# Patient Record
Sex: Male | Born: 1985 | Race: White | Hispanic: No | Marital: Married | Smoking: Never smoker
Health system: Southern US, Community
[De-identification: ages and names within clinical notes are randomized; demographics above are authoritative.]

## PROBLEM LIST (undated history)

## (undated) DIAGNOSIS — J45909 Unspecified asthma, uncomplicated: Secondary | ICD-10-CM

## (undated) HISTORY — DX: Unspecified asthma, uncomplicated: J45.909

---
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
## 2008-12-22 ENCOUNTER — Emergency Department (HOSPITAL_COMMUNITY): Admission: EM | Admit: 2008-12-22 | Discharge: 2008-12-22 | Payer: Self-pay | Admitting: Emergency Medicine

## 2014-09-17 ENCOUNTER — Ambulatory Visit: Payer: Self-pay | Admitting: Physical Therapy

## 2016-06-23 DIAGNOSIS — Z23 Encounter for immunization: Secondary | ICD-10-CM | POA: Diagnosis not present

## 2017-05-19 ENCOUNTER — Other Ambulatory Visit: Payer: Self-pay | Admitting: Family Medicine

## 2017-05-19 ENCOUNTER — Ambulatory Visit
Admission: RE | Admit: 2017-05-19 | Discharge: 2017-05-19 | Disposition: A | Discharge status: Home / Self Care | Payer: BLUE CROSS/BLUE SHIELD | Source: Ambulatory Visit | Attending: Family Medicine | Admitting: Family Medicine

## 2017-05-19 DIAGNOSIS — T1490XA Injury, unspecified, initial encounter: Secondary | ICD-10-CM

## 2017-05-19 DIAGNOSIS — S6992XA Unspecified injury of left wrist, hand and finger(s), initial encounter: Secondary | ICD-10-CM | POA: Diagnosis not present

## 2017-05-19 DIAGNOSIS — Z23 Encounter for immunization: Secondary | ICD-10-CM | POA: Diagnosis not present

## 2017-05-19 DIAGNOSIS — M25532 Pain in left wrist: Secondary | ICD-10-CM

## 2017-07-21 DIAGNOSIS — M25532 Pain in left wrist: Secondary | ICD-10-CM | POA: Diagnosis not present

## 2017-09-13 DIAGNOSIS — H3554 Dystrophies primarily involving the retinal pigment epithelium: Secondary | ICD-10-CM | POA: Diagnosis not present

## 2017-10-11 DIAGNOSIS — H3554 Dystrophies primarily involving the retinal pigment epithelium: Secondary | ICD-10-CM | POA: Diagnosis not present

## 2018-01-25 IMAGING — CR DG WRIST COMPLETE 3+V*L*
4 series · 4 of 4 positions shown · non-contrast
Comparison: None.

CLINICAL DATA: Pain following fall

EXAM:
LEFT WRIST - COMPLETE 3+ VIEW

[x wrist pa left]
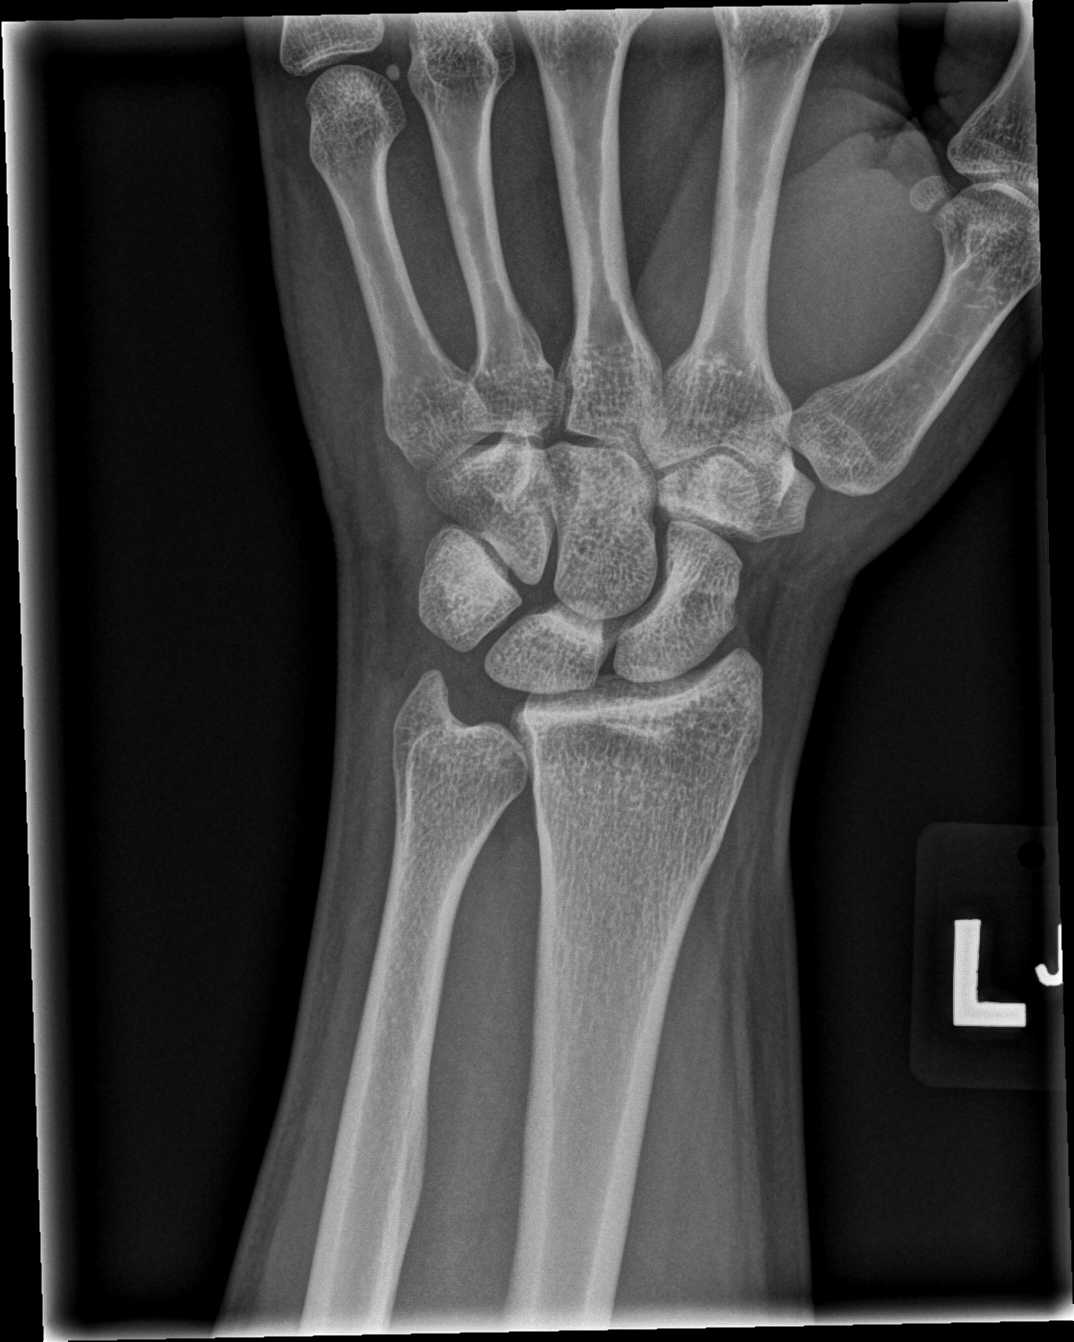

[x wrist navicular view left]
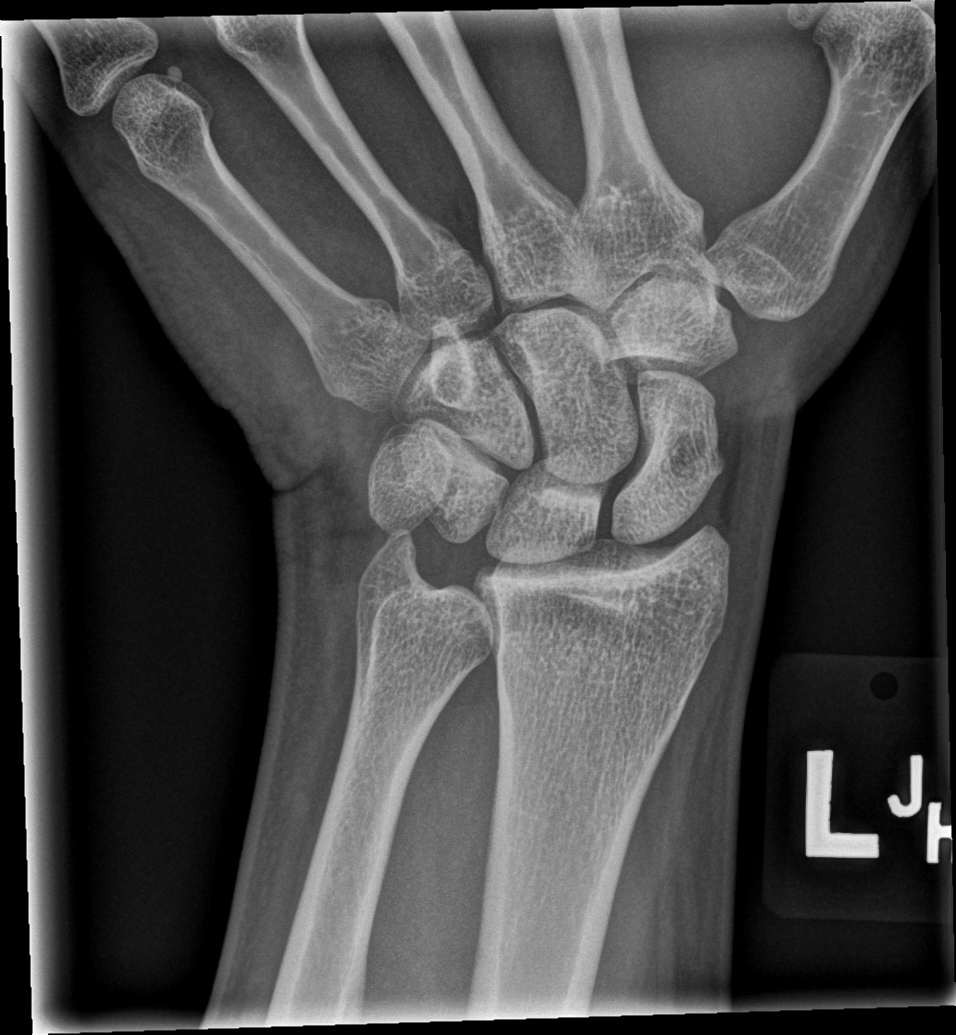

[x wrist obl left]
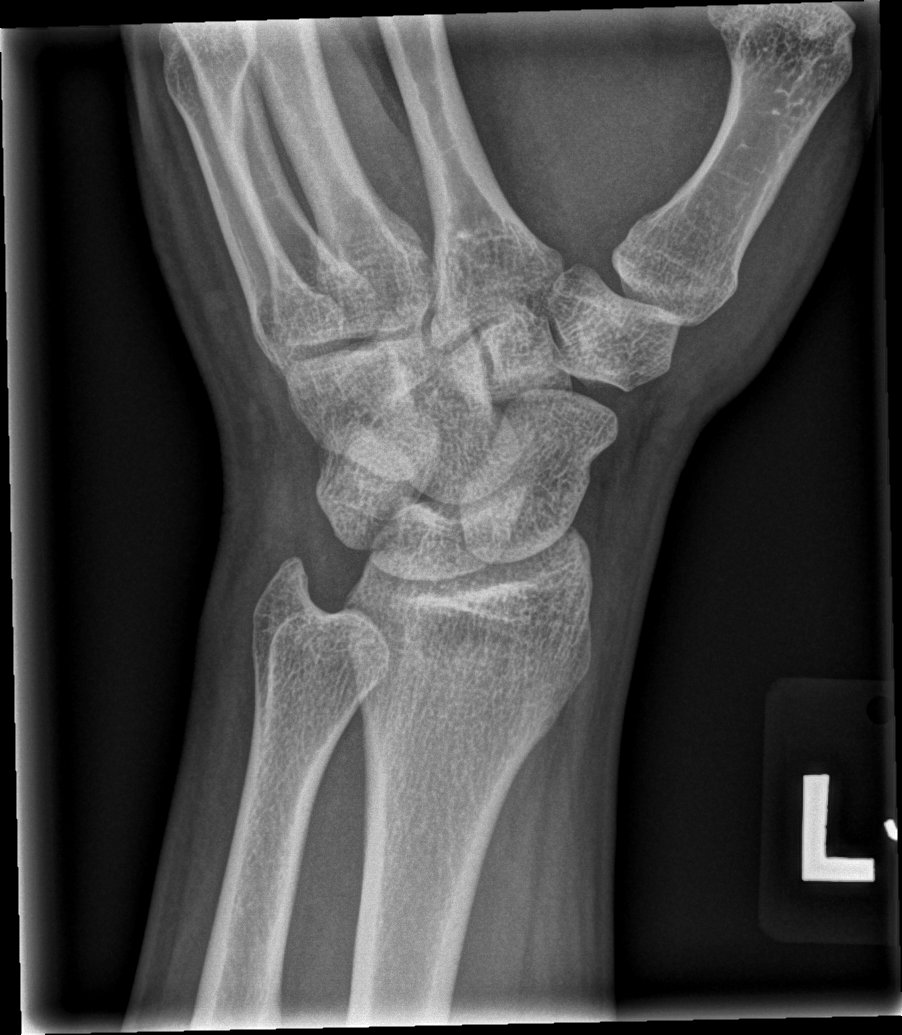

[x wrist lat left]
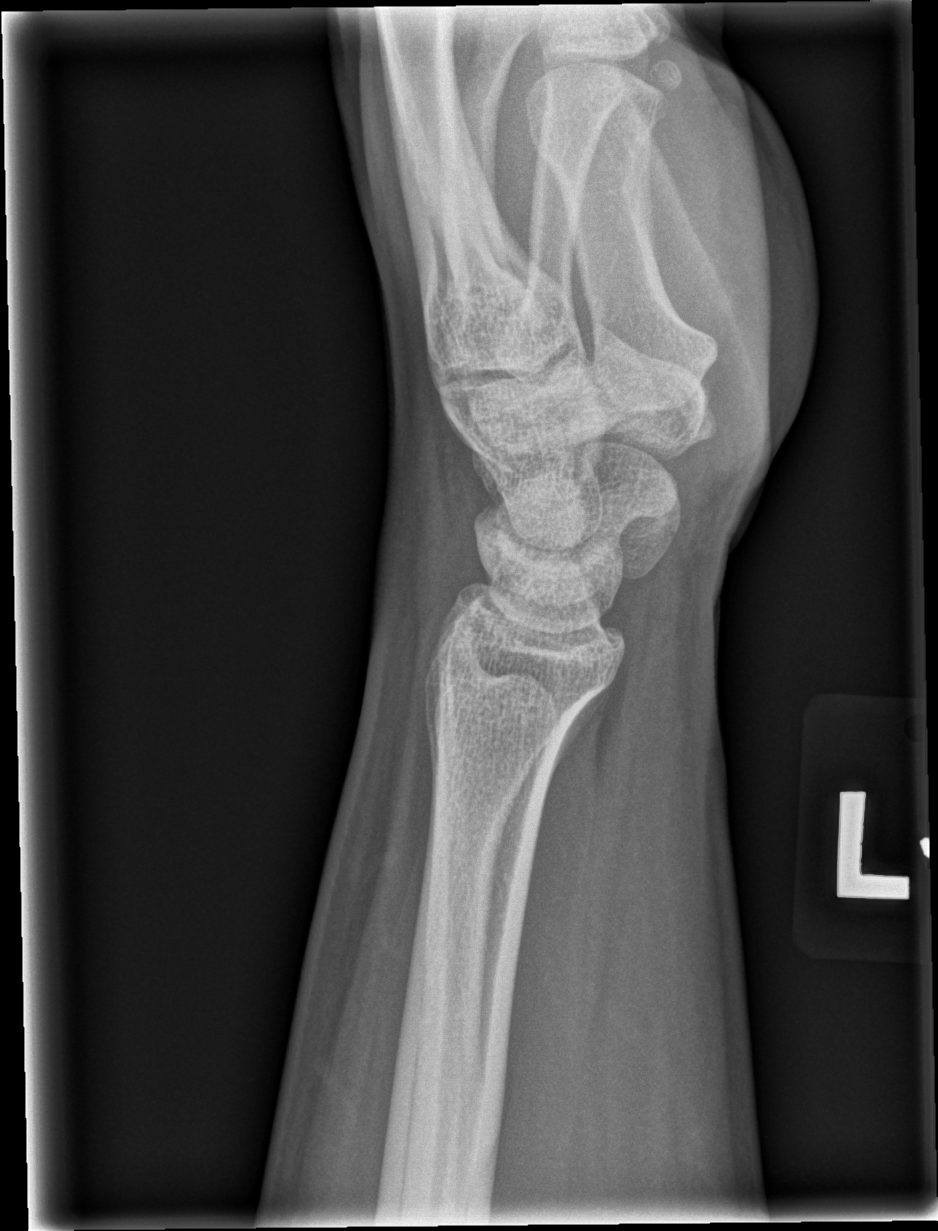

[4 of 4 positions shown; findings below may reference images not displayed]

FINDINGS: Frontal, oblique, lateral, and ulnar deviation scaphoid images were
obtained. No fracture or dislocation. There is a benign-appearing
cyst in the mid scaphoid bone. No appreciable joint space narrowing
or erosion. No abnormal calcification.
IMPRESSION: No fracture or dislocation. No evident arthropathy. Benign appearing
cyst in mid scaphoid bone.

## 2018-04-13 DIAGNOSIS — H3554 Dystrophies primarily involving the retinal pigment epithelium: Secondary | ICD-10-CM | POA: Diagnosis not present

## 2018-08-24 DIAGNOSIS — S0191XA Laceration without foreign body of unspecified part of head, initial encounter: Secondary | ICD-10-CM | POA: Diagnosis not present

## 2018-10-10 DIAGNOSIS — M278 Other specified diseases of jaws: Secondary | ICD-10-CM | POA: Diagnosis not present

## 2018-11-21 DIAGNOSIS — R109 Unspecified abdominal pain: Secondary | ICD-10-CM | POA: Diagnosis not present

## 2018-11-22 ENCOUNTER — Other Ambulatory Visit: Payer: Self-pay | Admitting: Family Medicine

## 2018-11-22 ENCOUNTER — Ambulatory Visit
Admission: RE | Admit: 2018-11-22 | Discharge: 2018-11-22 | Disposition: A | Discharge status: Home / Self Care | Payer: BLUE CROSS/BLUE SHIELD | Source: Ambulatory Visit | Attending: Family Medicine | Admitting: Family Medicine

## 2018-11-22 ENCOUNTER — Other Ambulatory Visit: Payer: Self-pay

## 2018-11-22 DIAGNOSIS — N200 Calculus of kidney: Secondary | ICD-10-CM | POA: Diagnosis not present

## 2018-11-22 DIAGNOSIS — R109 Unspecified abdominal pain: Secondary | ICD-10-CM

## 2019-05-11 DIAGNOSIS — Z23 Encounter for immunization: Secondary | ICD-10-CM | POA: Diagnosis not present

## 2019-07-31 IMAGING — CT CT ABDOMEN AND PELVIS WITHOUT CONTRAST
1 of 2 series · 13 of 32 positions shown, 18 images · non-contrast
Comparison: None.

CLINICAL DATA: Right flank pain

EXAM:
CT ABDOMEN AND PELVIS WITHOUT CONTRAST
TECHNIQUE: Multidetector CT imaging of the abdomen and pelvis was performed
following the standard protocol without oral or IV contrast.

[Series 2: abd/pelvis w/(date) · axial · 0.83mm/px · z∈[-516,-91]mm · 13 of 97 slices shown, 18 images]
[im 6/97  soft-tissue]
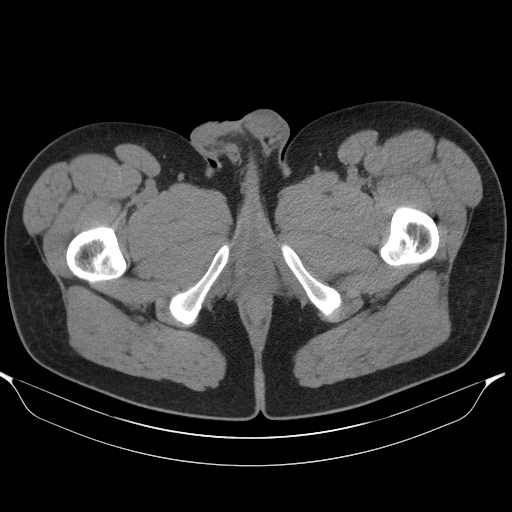
[im 6/97  bone]
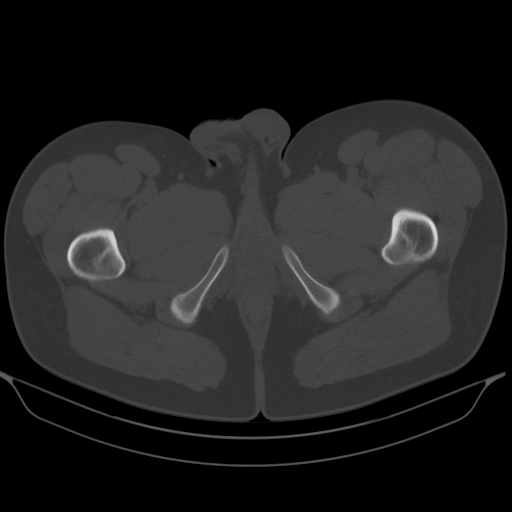
[im 17/97  soft-tissue]
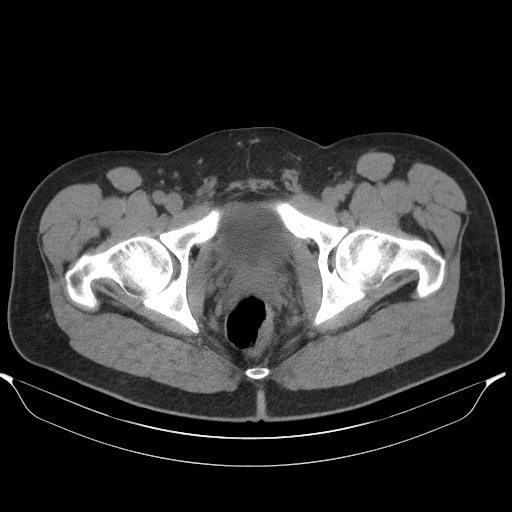
[im 22/97  soft-tissue]
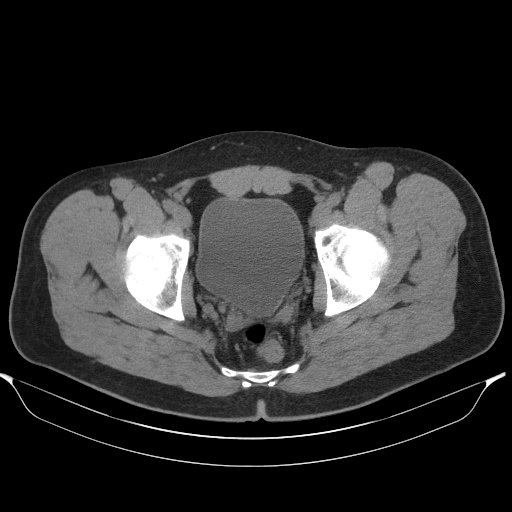
[im 27/97  soft-tissue]
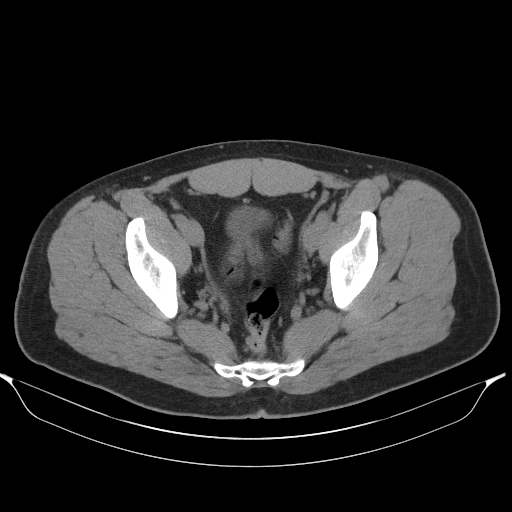
[im 38/97  soft-tissue]
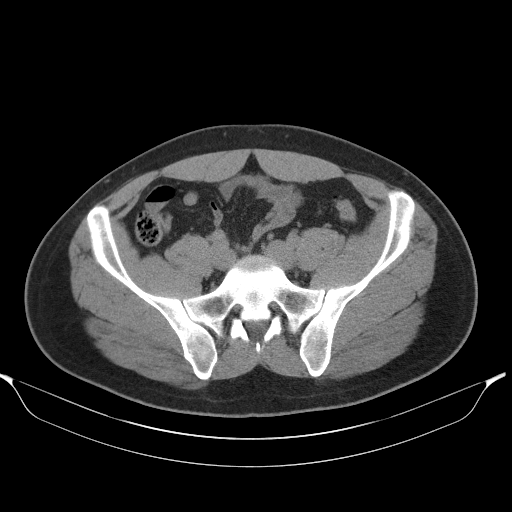
[im 43/97  soft-tissue]
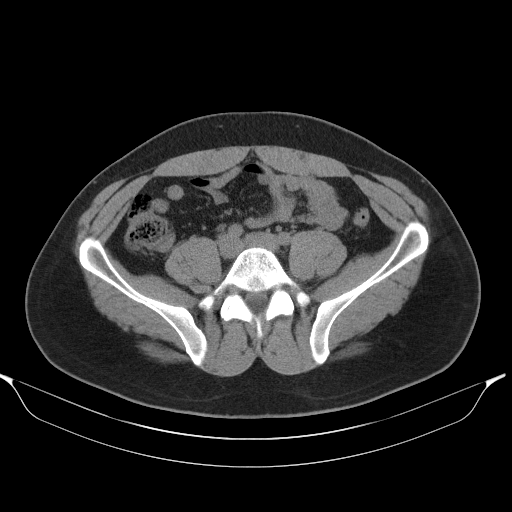
[im 54/97  soft-tissue]
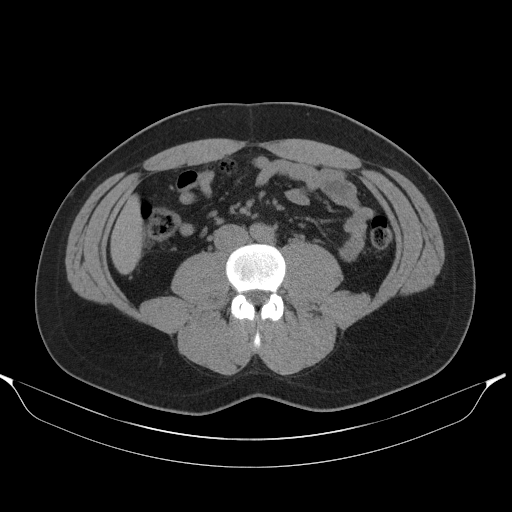
[im 59/97  soft-tissue]
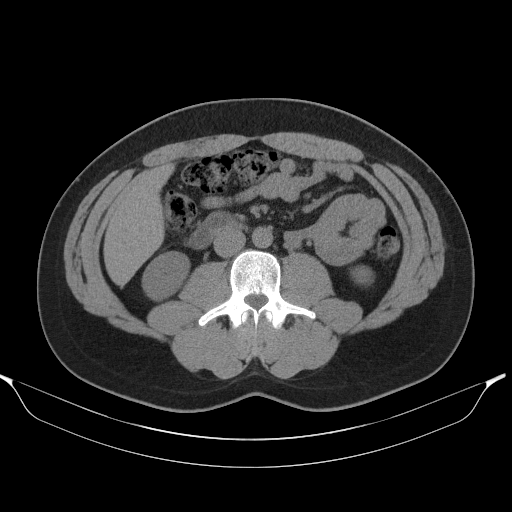
[im 70/97  soft-tissue]
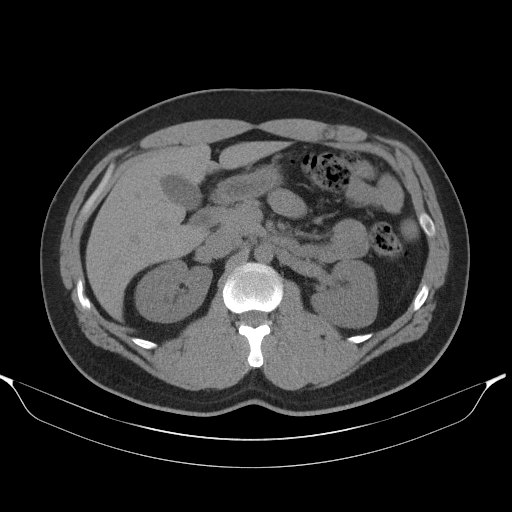
[im 70/97  bone]
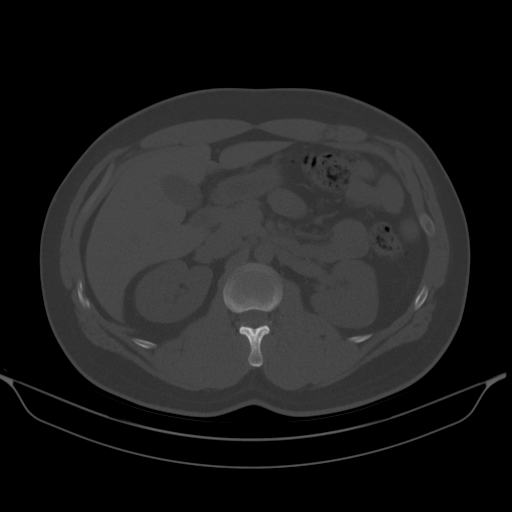
[im 75/97  soft-tissue]
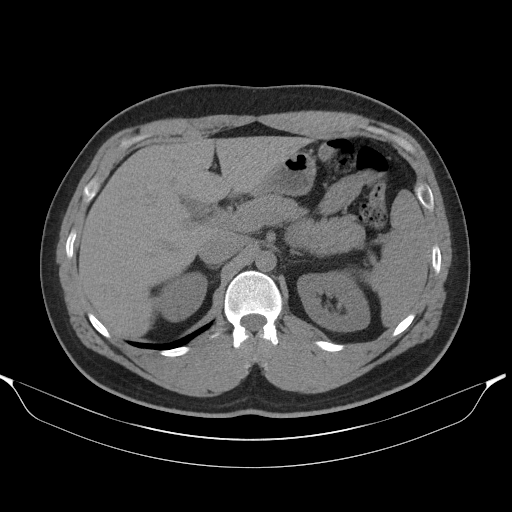
[im 75/97  lung]
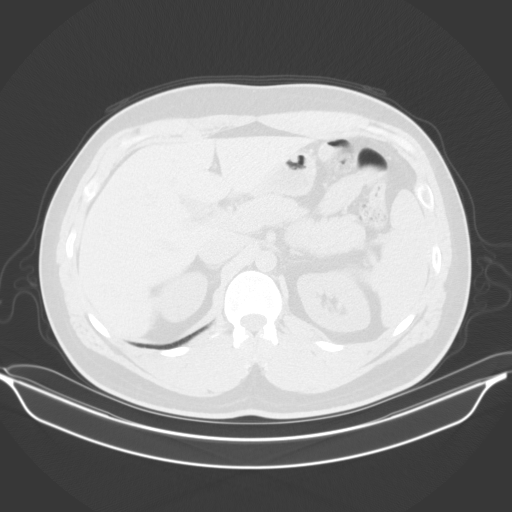
[im 81/97  soft-tissue]
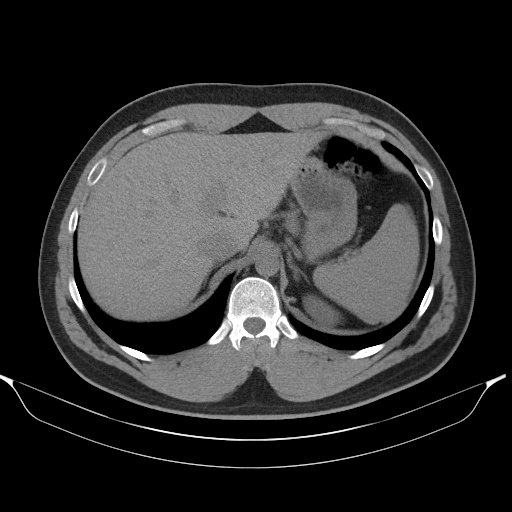
[im 81/97  lung]
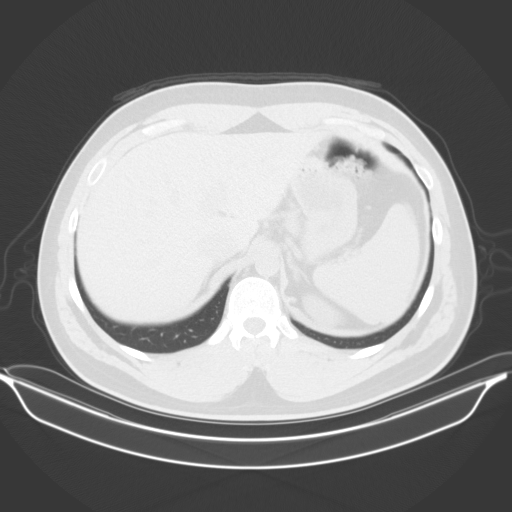
[im 86/97  lung]
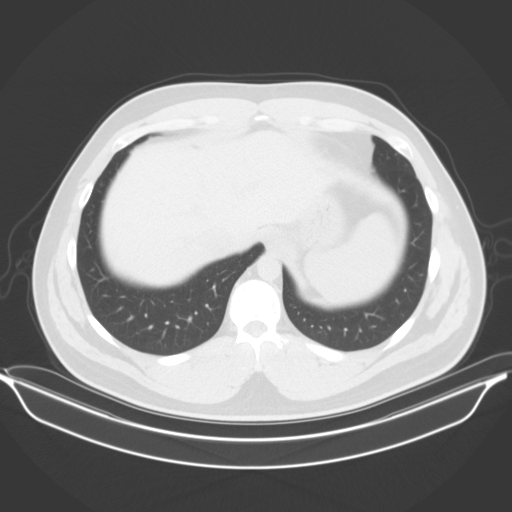
[im 91/97  soft-tissue]
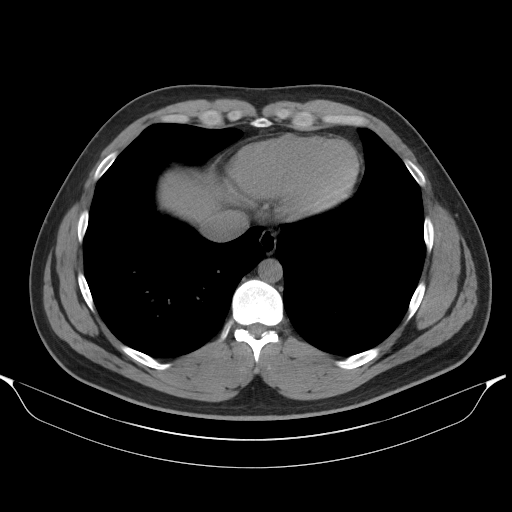
[im 91/97  lung]
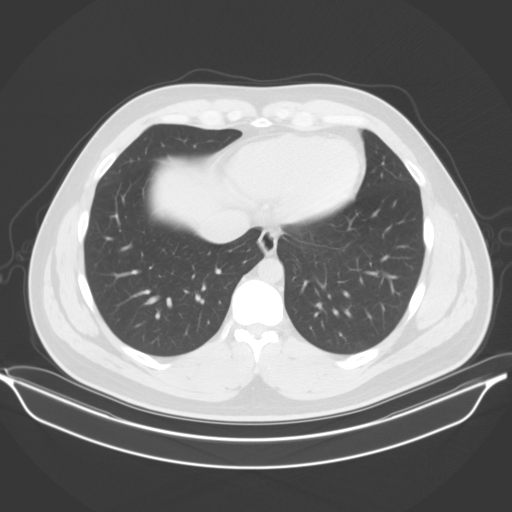

[13 of 32 positions shown; findings below may reference images not displayed]

FINDINGS: Lower chest: Lung bases are clear.

Hepatobiliary: No focal liver lesions are appreciable on this
noncontrast enhanced study. Gallbladder wall is not appreciably
thickened. There is no biliary duct dilatation.

Pancreas: There is no appreciable pancreatic mass or inflammatory
focus.

Spleen: No splenic lesions are evident.

Adrenals/Urinary Tract: Adrenals bilaterally appear normal. Kidneys
bilaterally show no evident mass or hydronephrosis on either side.
There is no appreciable renal or ureteral calculus on either side.
Urinary bladder is midline wall thickness within normal limits.

Stomach/Bowel: There is no appreciable bowel wall or mesenteric
thickening. There is no evident bowel obstruction. There is no free
air or portal venous air.

Vascular/Lymphatic: There is no abdominal aortic aneurysm. No
vascular lesions are evident on this noncontrast enhanced study.
There is no adenopathy in the abdomen or pelvis.

Reproductive: Prostate and seminal vesicles are normal in size and
contour. No evident pelvic mass.

Other: Appendix appears normal. There is no appreciable abscess or
ascites in the abdomen or pelvis.

Musculoskeletal: There is a benign-appearing lesion in the right
intertrochanteric region with a lucent central region and sclerotic
peripheral region measuring 1.8 x 1.8 cm. No destructive or purely
sclerotic type lesions are evident. No intramuscular or abdominal
wall lesion evident.
IMPRESSION: 1. A cause for patient's symptoms has not been established with this
study.

2. No hydronephrosis. No renal or ureteral calculus on either side.

3. No evident bowel wall thickening or bowel obstruction. No abscess
in the abdomen or pelvis. Appendix appears normal.

4. Benign-appearing lesion in the intertrochanteric region of the
proximal right femur with lucent central and sclerotic peripheral
regions. This lesion measures 1.8 x 1.8 cm.

## 2019-11-03 ENCOUNTER — Ambulatory Visit: Payer: 59 | Attending: Internal Medicine

## 2019-11-03 DIAGNOSIS — Z23 Encounter for immunization: Secondary | ICD-10-CM

## 2019-11-03 NOTE — Progress Notes (Signed)
   Covid-19 Vaccination Clinic  Name:  Colin Meadows    MRN: [id number] DOB: [birth year]  11/03/2019  [name] was observed post Covid-19 immunization for 15 minutes without incident. He was provided with Vaccine Information Sheet and instruction to access the V-Safe system.   [name] was instructed to call 911 with any severe reactions post vaccine: Marland Kitchen Difficulty breathing  . Swelling of face and throat  . A fast heartbeat  . A bad rash all over body  . Dizziness and weakness   Immunizations Administered    Name Date Dose VIS Date Route   Pfizer COVID-19 Vaccine 11/03/2019  8:52 AM 0.3 mL 07/14/2019 Intramuscular   Manufacturer: [employer], [employer]   Lot: [id number]   NDC: [address]-0

## 2019-11-29 ENCOUNTER — Ambulatory Visit: Payer: 59 | Attending: Internal Medicine

## 2019-11-29 DIAGNOSIS — Z23 Encounter for immunization: Secondary | ICD-10-CM

## 2019-11-29 NOTE — Progress Notes (Signed)
   Covid-19 Vaccination Clinic  Name:  Colin Meadows    MRN: [id number] DOB: 1985/08/21  11/29/2019  [name] was observed post Covid-19 immunization for 15 minutes without incident. He was provided with Vaccine Information Sheet and instruction to access the V-Safe system.   [name] was instructed to call 911 with any severe reactions post vaccine: Marland Kitchen Difficulty breathing  . Swelling of face and throat  . A fast heartbeat  . A bad rash all over body  . Dizziness and weakness   Immunizations Administered    Name Date Dose VIS Date Route   Pfizer COVID-19 Vaccine 11/29/2019  8:41 AM 0.3 mL 09/27/2018 Intramuscular   Manufacturer: [employer], [employer]   Lot: [id number]   NDC: [address]-2

## 2020-05-13 DIAGNOSIS — H3552 Pigmentary retinal dystrophy: Secondary | ICD-10-CM | POA: Diagnosis not present

## 2020-06-04 DIAGNOSIS — H35363 Drusen (degenerative) of macula, bilateral: Secondary | ICD-10-CM | POA: Diagnosis not present

## 2020-07-30 DIAGNOSIS — U071 COVID-19: Secondary | ICD-10-CM | POA: Diagnosis not present

## 2020-11-21 DIAGNOSIS — H3552 Pigmentary retinal dystrophy: Secondary | ICD-10-CM | POA: Diagnosis not present

## 2021-01-07 DIAGNOSIS — Z3009 Encounter for other general counseling and advice on contraception: Secondary | ICD-10-CM | POA: Diagnosis not present

## 2021-01-31 DIAGNOSIS — Z302 Encounter for sterilization: Secondary | ICD-10-CM | POA: Diagnosis not present

## 2021-09-15 ENCOUNTER — Ambulatory Visit (HOSPITAL_BASED_OUTPATIENT_CLINIC_OR_DEPARTMENT_OTHER): Payer: BC Managed Care – PPO | Admitting: Family Medicine

## 2021-09-15 ENCOUNTER — Encounter (HOSPITAL_BASED_OUTPATIENT_CLINIC_OR_DEPARTMENT_OTHER): Payer: Self-pay | Admitting: Family Medicine

## 2021-09-15 ENCOUNTER — Other Ambulatory Visit: Payer: Self-pay

## 2021-09-15 VITALS — BP 120/64 | HR 76 | Ht 74.0 in | Wt 238.0 lb

## 2021-09-15 DIAGNOSIS — J4520 Mild intermittent asthma, uncomplicated: Secondary | ICD-10-CM | POA: Diagnosis not present

## 2021-09-15 DIAGNOSIS — J45909 Unspecified asthma, uncomplicated: Secondary | ICD-10-CM | POA: Insufficient documentation

## 2021-09-15 DIAGNOSIS — Z0000 Encounter for general adult medical examination without abnormal findings: Secondary | ICD-10-CM

## 2021-09-15 MED ORDER — ALBUTEROL SULFATE HFA 108 (90 BASE) MCG/ACT IN AERS: 2.0000 | INHALATION_SPRAY | Freq: Four times a day (QID) | RESPIRATORY_TRACT | 2 refills | Status: AC | PRN

## 2021-09-15 NOTE — Progress Notes (Signed)
New Patient Office Visit  Subjective:  Patient ID: Colin Meadows, male    DOB: 03/26/1986  Age: 36 y.o. MRN: [id number]  CC:  Chief Complaint  Patient presents with   Establish Care    No prior PCP - No acute concerns or complaints today.    HPI Colin Meadows is a 36 year old male presenting to establish in clinic.  He has current concerns as outlined above.  Reports past medical history of asthma.  Asthma: Reports that he uses albuterol inhaler as needed.  Generally has very infrequent use of albuterol inhaler.  Did use it after coronavirus infection.  Has been using his wife's inhaler as he has not had his own recently.  Patient is originally from [place].  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  Outside of work he enjoys mountain biking, working out, has 3 young kids so spends lots of family time.  Past Medical History:  Diagnosis Date   Asthma 06[birth year]14[birth year]86   Diagnosed as a child.    History reviewed. No pertinent surgical history.  Family History  Problem Relation Age of Onset   Osteoporosis Mother    Heart disease Father    Vision loss Father    Macular degeneration Father    Alzheimer's disease Maternal Grandmother    Cancer Maternal Grandfather        stomach   Cancer Paternal Grandfather        stomach    Social History   Socioeconomic History   Marital status: Married    Spouse name: Not on file   Number of children: Not on file   Years of education: Not on file   Highest education level: Not on file  Occupational History   Not on file  Tobacco Use   Smoking status: Never   Smokeless tobacco: Never  Vaping Use   Vaping Use: Never used  Substance and Sexual Activity   Alcohol use: Yes    Alcohol/week: 2.0 standard drinks    Types: 2 Cans of beer per week   Drug use: Never   Sexual activity: Yes    Birth control/protection: Surgical    Comment: Vasectomy  Other Topics Concern   Not on file  Social History Narrative    Not on file   Social Determinants of Health   Financial Resource Strain: Not on file  Food Insecurity: Not on file  Transportation Needs: Not on file  Physical Activity: Not on file  Stress: Not on file  Social Connections: Not on file  Intimate Partner Violence: Not on file    Objective:   Today's Vitals: BP 120/64    Pulse 76    Ht 6\' 2"  (1.88 m)    Wt 238 lb (108 kg)    SpO2 98%    BMI 30.56 kg/m   Physical Exam  36 year old male in no acute distress Cardiovascular exam with regular rate and rhythm, no murmur appreciated Lungs clear to auscultation bilaterally  Assessment & Plan:   Problem List Items Addressed This Visit       Respiratory   Asthma - Primary    By history appears to be mild intermittent Sent in prescription for albuterol inhaler to be used as needed      Relevant Medications   albuterol (VENTOLIN HFA) 108 (90 Base) MCG/ACT inhaler   Other Visit Diagnoses     Wellness examination       Relevant Orders   CBC with Differential/Platelet   Comprehensive  metabolic panel   Lipid panel   TSH Rfx on Abnormal to Free T4       Outpatient Encounter Medications as of 09/15/2021  Medication Sig   albuterol (VENTOLIN HFA) 108 (90 Base) MCG/ACT inhaler Inhale 2 puffs into the lungs every 6 (six) hours as needed for wheezing.   No facility-administered encounter medications on file as of 09/15/2021.    Follow-up: Return in about 2 months (around 11/13/2021) for Follow Up.  Plan for follow-up in about 1 to 2 months for CPE with labs to be completed 1 week prior  Daisey Caloca J [place], MD

## 2021-09-15 NOTE — Patient Instructions (Signed)
°  Medication Instructions:  °Your physician recommends that you continue on your current medications as directed. Please refer to the Current Medication list given to you today. °--If you need a refill on any your medications before your next appointment, please call your pharmacy first. If no refills are authorized on file call the office.-- °Follow-Up: °Your next appointment:   °Your physician recommends that you schedule a follow-up appointment in: 1-2 MONTHS for CPE with Dr. de [place] ° °You will receive a text message or e-mail with a link to a survey about your care and experience with us today! We would greatly appreciate your feedback!  ° °Thanks for letting us be apart of your health journey!!  °Primary Care and Sports Medicine  ° °Dr. Raymond de Cuba  ° °We encourage you to activate your patient portal called "MyChart".  Sign up information is provided on this After Visit Summary.  MyChart is used to connect with patients for Virtual Visits (Telemedicine).  Patients are able to view lab/test results, encounter notes, upcoming appointments, etc.  Non-urgent messages can be sent to your provider as well. To learn more about what you can do with MyChart, please visit --  [email or web address]   ° °

## 2021-09-15 NOTE — Assessment & Plan Note (Signed)
By history appears to be mild intermittent Sent in prescription for albuterol inhaler to be used as needed

## 2021-09-26 ENCOUNTER — Ambulatory Visit (HOSPITAL_BASED_OUTPATIENT_CLINIC_OR_DEPARTMENT_OTHER): Payer: 59 | Admitting: Family Medicine

## 2021-10-06 ENCOUNTER — Other Ambulatory Visit: Payer: Self-pay

## 2021-10-06 ENCOUNTER — Ambulatory Visit (HOSPITAL_BASED_OUTPATIENT_CLINIC_OR_DEPARTMENT_OTHER): Payer: BC Managed Care – PPO

## 2021-10-06 DIAGNOSIS — Z0000 Encounter for general adult medical examination without abnormal findings: Secondary | ICD-10-CM

## 2021-10-07 LAB — COMPREHENSIVE METABOLIC PANEL
ALT: 38 IU/L (ref 0–44)
AST: 40 IU/L (ref 0–40)
Albumin/Globulin Ratio: 1.9 (ref 1.2–2.2)
Albumin: 4.5 g/dL (ref 4.0–5.0)
Alkaline Phosphatase: 56 IU/L (ref 44–121)
BUN/Creatinine Ratio: 16 (ref 9–20)
BUN: 22 mg/dL — ABNORMAL HIGH (ref 6–20)
Bilirubin Total: 0.4 mg/dL (ref 0.0–1.2)
CO2: 21 mmol/L (ref 20–29)
Calcium: 9.7 mg/dL (ref 8.7–10.2)
Chloride: 105 mmol/L (ref 96–106)
Creatinine, Ser: 1.37 mg/dL — ABNORMAL HIGH (ref 0.76–1.27)
Globulin, Total: 2.4 g/dL (ref 1.5–4.5)
Glucose: 93 mg/dL (ref 70–99)
Potassium: 4.7 mmol/L (ref 3.5–5.2)
Sodium: 140 mmol/L (ref 134–144)
Total Protein: 6.9 g/dL (ref 6.0–8.5)
eGFR: 69 mL/min/{1.73_m2} (ref >59)

## 2021-10-07 LAB — CBC WITH DIFFERENTIAL/PLATELET
Basophils Absolute: 0 10*3/uL (ref 0.0–0.2)
Basos: 1 %
EOS (ABSOLUTE): 0.2 10*3/uL (ref 0.0–0.4)
Eos: 4 %
Hematocrit: 46.1 % (ref 37.5–51.0)
Hemoglobin: 15.9 g/dL (ref 13.0–17.7)
Immature Grans (Abs): 0 10*3/uL (ref 0.0–0.1)
Immature Granulocytes: 0 %
Lymphocytes Absolute: 2.5 10*3/uL (ref 0.7–3.1)
Lymphs: 42 %
MCH: 30.8 pg (ref 26.6–33.0)
MCHC: 34.5 g/dL (ref 31.5–35.7)
MCV: 89 fL (ref 79–97)
Monocytes Absolute: 0.6 10*3/uL (ref 0.1–0.9)
Monocytes: 11 %
Neutrophils Absolute: 2.6 10*3/uL (ref 1.4–7.0)
Neutrophils: 42 %
Platelets: 275 10*3/uL (ref 150–450)
RBC: 5.17 x10E6/uL (ref 4.14–5.80)
RDW: 13 % (ref 11.6–15.4)
WBC: 6.1 10*3/uL (ref 3.4–10.8)

## 2021-10-07 LAB — LIPID PANEL
Chol/HDL Ratio: 3 ratio (ref 0.0–5.0)
Cholesterol, Total: 155 mg/dL (ref 100–199)
HDL: 52 mg/dL (ref >39)
LDL Chol Calc (NIH): 91 mg/dL (ref 0–99)
Triglycerides: 59 mg/dL (ref 0–149)
VLDL Cholesterol Cal: 12 mg/dL (ref 5–40)

## 2021-10-07 LAB — TSH RFX ON ABNORMAL TO FREE T4: TSH: 2.96 u[IU]/mL (ref 0.450–4.500)

## 2021-10-10 ENCOUNTER — Telehealth (HOSPITAL_BASED_OUTPATIENT_CLINIC_OR_DEPARTMENT_OTHER): Payer: Self-pay

## 2021-10-13 ENCOUNTER — Encounter (HOSPITAL_BASED_OUTPATIENT_CLINIC_OR_DEPARTMENT_OTHER): Payer: BC Managed Care – PPO | Admitting: Family Medicine

## 2021-11-06 ENCOUNTER — Ambulatory Visit (INDEPENDENT_AMBULATORY_CARE_PROVIDER_SITE_OTHER): Payer: BC Managed Care – PPO | Admitting: Family Medicine

## 2021-11-06 ENCOUNTER — Encounter (HOSPITAL_BASED_OUTPATIENT_CLINIC_OR_DEPARTMENT_OTHER): Payer: Self-pay | Admitting: Family Medicine

## 2021-11-06 VITALS — BP 134/70 | HR 76 | Temp 97.4°F | Ht 74.0 in | Wt 230.2 lb

## 2021-11-06 DIAGNOSIS — Z0000 Encounter for general adult medical examination without abnormal findings: Secondary | ICD-10-CM | POA: Diagnosis not present

## 2021-11-06 DIAGNOSIS — Z23 Encounter for immunization: Secondary | ICD-10-CM | POA: Diagnosis not present

## 2021-11-06 NOTE — Assessment & Plan Note (Addendum)
Routine HCM labs reviewed. HCM reviewed/discussed. Anticipatory guidance regarding healthy weight, lifestyle and choices given. ?Recommend healthy diet.  Recommend approximately 150 minutes/week of moderate intensity exercise ?Recommend regular dental and vision exams ?Always use seatbelt/lap and shoulder restraints ?Recommend using smoke alarms and checking batteries at least twice a year ?Recommend using sunscreen when outside ?Due for tetanus, administered today ?He has been having some general fatigue, intermittent diarrhea, bloating.  Recent labs were reassuring.  Fatigue, GI symptoms could be related to dietary causes.  Did provide handout with low FODMAP diet information.  Patient will make appropriate changes and is still having symptoms, will schedule follow-up visit in the future at his preference ?

## 2021-11-06 NOTE — Patient Instructions (Signed)
Preventive Care 21-36 Years Old, Male ?Preventive care refers to lifestyle choices and visits with your health care provider that can promote health and wellness. Preventive care visits are also called wellness exams. ?What can I expect for my preventive care visit? ?Counseling ?During your preventive care visit, your health care provider may ask about your: ?Medical history, including: ?Past medical problems. ?Family medical history. ?Current health, including: ?Emotional well-being. ?Home life and relationship well-being. ?Sexual activity. ?Lifestyle, including: ?Alcohol, nicotine or tobacco, and drug use. ?Access to firearms. ?Diet, exercise, and sleep habits. ?Safety issues such as seatbelt and bike helmet use. ?Sunscreen use. ?Work and work environment. ?Physical exam ?Your health care provider may check your: ?Height and weight. These may be used to calculate your BMI (body mass index). BMI is a measurement that tells if you are at a healthy weight. ?Waist circumference. This measures the distance around your waistline. This measurement also tells if you are at a healthy weight and may help predict your risk of certain diseases, such as type 2 diabetes and high blood pressure. ?Heart rate and blood pressure. ?Body temperature. ?Skin for abnormal spots. ?What immunizations do I need? ?Vaccines are usually given at various ages, according to a schedule. Your health care provider will recommend vaccines for you based on your age, medical history, and lifestyle or other factors, such as travel or where you work. ?What tests do I need? ?Screening ?Your health care provider may recommend screening tests for certain conditions. This may include: ?Lipid and cholesterol levels. ?Diabetes screening. This is done by checking your blood sugar (glucose) after you have not eaten for a while (fasting). ?Hepatitis B test. ?Hepatitis C test. ?HIV (human immunodeficiency virus) test. ?STI (sexually transmitted infection)  testing, if you are at risk. ?Talk with your health care provider about your test results, treatment options, and if necessary, the need for more tests. ?Follow these instructions at home: ?Eating and drinking ? ?Eat a healthy diet that includes fresh fruits and vegetables, whole grains, lean protein, and low-fat dairy products. ?Drink enough fluid to keep your urine pale yellow. ?Take vitamin and mineral supplements as recommended by your health care provider. ?Do not drink alcohol if your health care provider tells you not to drink. ?If you drink alcohol: ?Limit how much you have to 0-2 drinks a day. ?Know how much alcohol is in your drink. In the [place] one drink equals one 12 oz bottle of beer (355 mL), one 5 oz glass of wine (148 mL), or one 1? oz glass of hard liquor (44 mL). ?Lifestyle ?Brush your teeth every morning and night with fluoride toothpaste. Floss one time each day. ?Exercise for at least 30 minutes 5 or more days each week. ?Do not use any products that contain nicotine or tobacco. These products include cigarettes, chewing tobacco, and vaping devices, such as e-cigarettes. If you need help quitting, ask your health care provider. ?Do not use drugs. ?If you are sexually active, practice safe sex. Use a condom or other form of protection to prevent STIs. ?Find healthy ways to manage stress, such as: ?Meditation, yoga, or listening to music. ?Journaling. ?Talking to a trusted person. ?Spending time with friends and family. ?Minimize exposure to UV radiation to reduce your risk of skin cancer. ?Safety ?Always wear your seat belt while driving or riding in a vehicle. ?Do not drive: ?If you have been drinking alcohol. Do not ride with someone who has been drinking. ?If you have been using any mind-altering substances or   drugs. ?While texting. ?When you are tired or distracted. ?Wear a helmet and other protective equipment during sports activities. ?If you have firearms in your house, make sure you  follow all gun safety procedures. ?Seek help if you have been physically or sexually abused. ?What's next? ?Go to your health care provider once a year for an annual wellness visit. ?Ask your health care provider how often you should have your eyes and teeth checked. ?Stay up to date on all vaccines. ?This information is not intended to replace advice given to you by your health care provider. Make sure you discuss any questions you have with your health care provider. ?Document Revised: 01/15/2021 Document Reviewed: 01/15/2021 ?Elsevier Patient Education ? 2022 [employer] ? ?

## 2021-11-06 NOTE — Progress Notes (Signed)
?Subjective:   ? ?CC: Annual Physical Exam ? ?HPI:  ?Colin Meadows is a 36 y.o. presenting for annual physical ? ?I reviewed the past medical history, family history, social history, surgical history, and allergies today and no changes were needed.  Please see the problem list section below in epic for further details. ? ?Past Medical History: ?Past Medical History:  ?Diagnosis Date  ? Asthma Feb 06[birth year] 1986  ? Diagnosed as a child.  ? ?Past Surgical History: ?History reviewed. No pertinent surgical history. ?Social History: ?Social History  ? ?Socioeconomic History  ? Marital status: Married  ?  Spouse name: Not on file  ? Number of children: Not on file  ? Years of education: Not on file  ? Highest education level: Not on file  ?Occupational History  ? Not on file  ?Tobacco Use  ? Smoking status: Never  ? Smokeless tobacco: Never  ?Vaping Use  ? Vaping Use: Never used  ?Substance and Sexual Activity  ? Alcohol use: Yes  ?  Alcohol/week: 2.0 standard drinks  ?  Types: 2 Cans of beer per week  ? Drug use: Never  ? Sexual activity: Yes  ?  Birth control/protection: Surgical  ?  Comment: Vasectomy  ?Other Topics Concern  ? Not on file  ?Social History Narrative  ? Not on file  ? ?Social Determinants of Health  ? ?Financial Resource Strain: Not on file  ?Food Insecurity: Not on file  ?Transportation Needs: Not on file  ?Physical Activity: Not on file  ?Stress: Not on file  ?Social Connections: Not on file  ? ?Family History: ?Family History  ?Problem Relation Age of Onset  ? Osteoporosis Mother   ? Heart disease Father   ? Vision loss Father   ? Macular degeneration Father   ? Alzheimer's disease Maternal Grandmother   ? Cancer Maternal Grandfather   ?     stomach  ? Cancer Paternal Grandfather   ?     stomach  ? ?Allergies: ?No Known Allergies ?Medications: See med rec. ? ?Review of Systems: No headache, visual changes, nausea, vomiting, diarrhea, constipation, dizziness, abdominal pain, skin rash, fevers, chills,  night sweats, swollen lymph nodes, weight loss, chest pain, body aches, joint swelling, muscle aches, shortness of breath, mood changes, visual or auditory hallucinations. ? ?Objective:   ? ?BP 134/70   Pulse 76   Temp (!) 97.4 ?F (36.3 ?C)   Ht 6\' 2"  (1.88 m)   Wt 230 lb 3.2 oz (104.4 kg)   SpO2 98%   BMI 29.56 kg/m?  ? ?General: Well Developed, well nourished, and in no acute distress.  ?Neuro: Alert and oriented x3, extra-ocular muscles intact, sensation grossly intact. Cranial nerves II through XII are intact, motor, sensory, and coordinative functions are all intact. ?HEENT: Normocephalic, atraumatic, pupils equal round reactive to light, neck supple, no masses, no lymphadenopathy, thyroid nonpalpable. Oropharynx, nasopharynx, external ear canals are unremarkable. ?Skin: Warm and dry, no rashes noted.  ?Cardiac: Regular rate and rhythm, no murmurs rubs or gallops.  ?Respiratory: Clear to auscultation bilaterally. Not using accessory muscles, speaking in full sentences.  ?Abdominal: Soft, nontender, nondistended, positive bowel sounds, no masses, no organomegaly.  ?Musculoskeletal: Shoulder, elbow, wrist, hip, knee, ankle stable, and with full range of motion. ? ?Impression and Recommendations:   ? ?Wellness examination ?Routine HCM labs reviewed. HCM reviewed/discussed. Anticipatory guidance regarding healthy weight, lifestyle and choices given. ?Recommend healthy diet.  Recommend approximately 150 minutes/week of moderate intensity exercise ?Recommend regular dental and vision  exams ?Always use seatbelt/lap and shoulder restraints ?Recommend using smoke alarms and checking batteries at least twice a year ?Recommend using sunscreen when outside ?Due for tetanus, administered today ?He has been having some general fatigue, intermittent diarrhea, bloating.  Recent labs were reassuring.  Fatigue, GI symptoms could be related to dietary causes.  Did provide handout with low FODMAP diet information.  Patient  will make appropriate changes and is still having symptoms, will schedule follow-up visit in the future at his preference ? ?Plan for follow-up in about 1 year or sooner as needed ? ? ?___________________________________________ ?Enoch Moffa de [place], MD, ABFM, CAQSM ?Primary Care and Sports Medicine ?[facility] ?

## 2021-11-13 ENCOUNTER — Ambulatory Visit (HOSPITAL_BASED_OUTPATIENT_CLINIC_OR_DEPARTMENT_OTHER): Payer: BC Managed Care – PPO | Admitting: Family Medicine

## 2021-11-25 DIAGNOSIS — H3552 Pigmentary retinal dystrophy: Secondary | ICD-10-CM | POA: Diagnosis not present

## 2021-12-12 DIAGNOSIS — J029 Acute pharyngitis, unspecified: Secondary | ICD-10-CM | POA: Diagnosis not present

## 2022-01-13 DIAGNOSIS — A09 Infectious gastroenteritis and colitis, unspecified: Secondary | ICD-10-CM | POA: Diagnosis not present

## 2022-11-02 ENCOUNTER — Other Ambulatory Visit (HOSPITAL_BASED_OUTPATIENT_CLINIC_OR_DEPARTMENT_OTHER): Payer: Self-pay

## 2022-11-02 ENCOUNTER — Ambulatory Visit (HOSPITAL_BASED_OUTPATIENT_CLINIC_OR_DEPARTMENT_OTHER): Payer: BC Managed Care – PPO

## 2022-11-02 DIAGNOSIS — Z0000 Encounter for general adult medical examination without abnormal findings: Secondary | ICD-10-CM

## 2022-11-03 LAB — COMPREHENSIVE METABOLIC PANEL
ALT: 22 IU/L (ref 0–44)
AST: 21 IU/L (ref 0–40)
Albumin/Globulin Ratio: 1.7 (ref 1.2–2.2)
Albumin: 4.3 g/dL (ref 4.1–5.1)
Alkaline Phosphatase: 67 IU/L (ref 44–121)
BUN/Creatinine Ratio: 14 (ref 9–20)
BUN: 20 mg/dL (ref 6–20)
Bilirubin Total: 0.3 mg/dL (ref 0.0–1.2)
CO2: 18 mmol/L — ABNORMAL LOW (ref 20–29)
Calcium: 9.1 mg/dL (ref 8.7–10.2)
Chloride: 107 mmol/L — ABNORMAL HIGH (ref 96–106)
Creatinine, Ser: 1.47 mg/dL — ABNORMAL HIGH (ref 0.76–1.27)
Globulin, Total: 2.6 g/dL (ref 1.5–4.5)
Glucose: 103 mg/dL — ABNORMAL HIGH (ref 70–99)
Potassium: 4.6 mmol/L (ref 3.5–5.2)
Sodium: 141 mmol/L (ref 134–144)
Total Protein: 6.9 g/dL (ref 6.0–8.5)
eGFR: 63 mL/min/{1.73_m2} (ref >59)

## 2022-11-03 LAB — CBC WITH DIFFERENTIAL/PLATELET
Basophils Absolute: 0.1 10*3/uL (ref 0.0–0.2)
Basos: 1 %
EOS (ABSOLUTE): 0.4 10*3/uL (ref 0.0–0.4)
Eos: 5 %
Hematocrit: 47.5 % (ref 37.5–51.0)
Hemoglobin: 16.1 g/dL (ref 13.0–17.7)
Immature Grans (Abs): 0 10*3/uL (ref 0.0–0.1)
Immature Granulocytes: 0 %
Lymphocytes Absolute: 3.1 10*3/uL (ref 0.7–3.1)
Lymphs: 38 %
MCH: 30.3 pg (ref 26.6–33.0)
MCHC: 33.9 g/dL (ref 31.5–35.7)
MCV: 89 fL (ref 79–97)
Monocytes Absolute: 0.7 10*3/uL (ref 0.1–0.9)
Monocytes: 8 %
Neutrophils Absolute: 3.8 10*3/uL (ref 1.4–7.0)
Neutrophils: 48 %
Platelets: 259 10*3/uL (ref 150–450)
RBC: 5.32 x10E6/uL (ref 4.14–5.80)
RDW: 12.8 % (ref 11.6–15.4)
WBC: 8 10*3/uL (ref 3.4–10.8)

## 2022-11-03 LAB — HEMOGLOBIN A1C
Est. average glucose Bld gHb Est-mCnc: 108 mg/dL
Hgb A1c MFr Bld: 5.4 % (ref 4.8–5.6)

## 2022-11-03 LAB — LIPID PANEL
Chol/HDL Ratio: 3.2 ratio (ref 0.0–5.0)
Cholesterol, Total: 164 mg/dL (ref 100–199)
HDL: 52 mg/dL (ref >39)
LDL Chol Calc (NIH): 96 mg/dL (ref 0–99)
Triglycerides: 83 mg/dL (ref 0–149)
VLDL Cholesterol Cal: 16 mg/dL (ref 5–40)

## 2022-11-03 LAB — TSH: TSH: 3.32 u[IU]/mL (ref 0.450–4.500)

## 2022-11-09 ENCOUNTER — Encounter (HOSPITAL_BASED_OUTPATIENT_CLINIC_OR_DEPARTMENT_OTHER): Payer: BC Managed Care – PPO | Admitting: Family Medicine

## 2022-11-09 ENCOUNTER — Encounter (HOSPITAL_BASED_OUTPATIENT_CLINIC_OR_DEPARTMENT_OTHER): Payer: Self-pay | Admitting: Family Medicine

## 2022-11-09 ENCOUNTER — Ambulatory Visit (INDEPENDENT_AMBULATORY_CARE_PROVIDER_SITE_OTHER): Payer: BC Managed Care – PPO | Admitting: Family Medicine

## 2022-11-09 VITALS — BP 137/71 | HR 60 | Temp 97.8°F | Ht 74.0 in | Wt 231.8 lb

## 2022-11-09 DIAGNOSIS — Z0000 Encounter for general adult medical examination without abnormal findings: Secondary | ICD-10-CM | POA: Diagnosis not present

## 2022-11-09 DIAGNOSIS — R7989 Other specified abnormal findings of blood chemistry: Secondary | ICD-10-CM

## 2022-11-09 NOTE — Progress Notes (Signed)
Complete physical exam  Patient: Colin Meadows   DOB: 12/02/1985   37 y.o. Male  MRN: [id number]  Subjective:    Chief Complaint  Patient presents with   Annual Exam    Pt here for annual exam     Fitzhugh Manas is a 37 y.o. male who presents today for a complete physical exam. He reports consuming a general diet. Normally tries to eat healthy- adds protein and fiber. Gym/ health club routine includes cardio and mod to heavy weightlifting. He generally feels well. He reports sleeping fairly well. Waking up more tired this past month- has 3 kids. He does not have additional problems to discuss today.   Asthma- has not had to use at all this year.  Last year felt like he had to use to it more often, for a couple of months multiple days per week   More protein intake  Probably doesn't drink enough water  Fasting labs with serum creatinine   I reviewed the past medical history, family history, social history, surgical history, and allergies today and no changes were needed.  Please see the problem list section below in epic for further details.    Depression screenings:    11/09/2022    1:17 PM 11/06/2021    1:34 PM 09/15/2021    3:37 PM  Depression screen PHQ 2/9  Decreased Interest 0 0 0  Down, Depressed, Hopeless 0 0 1  PHQ - 2 Score 0 0 1    Anxiety screenings:    11/09/2022    1:17 PM  GAD 7 : Generalized Anxiety Score  Nervous, Anxious, on Edge 0  Control/stop worrying 0  Worry too much - different things 0  Trouble relaxing 0  Restless 0  Easily annoyed or irritable 0  Afraid - awful might happen 0  Total GAD 7 Score 0  Anxiety Difficulty Not difficult at all   Dentist: q6 months  Vision: going at the end of this month, annually   Patient Care Team: [place], Buren Kos, MD as PCP - General (Family Medicine) Farris Has, MD (Family Medicine)   Outpatient Medications Prior to Visit  Medication Sig   albuterol (VENTOLIN HFA) 108 (90 Base) MCG/ACT  inhaler Inhale 2 puffs into the lungs every 6 (six) hours as needed for wheezing. (Patient not taking: Reported on 11/09/2022)   No facility-administered medications prior to visit.    Review of Systems  Constitutional:  Negative for malaise/fatigue.  Respiratory:  Negative for cough and shortness of breath.   Cardiovascular:  Negative for chest pain and palpitations.  Gastrointestinal:  Negative for abdominal pain, nausea and vomiting.  Musculoskeletal:  Negative for myalgias.  Neurological:  Negative for dizziness, weakness and headaches.  Psychiatric/Behavioral:  Negative for depression and suicidal ideas. The patient is not nervous/anxious and does not have insomnia.        Objective:     BP 137/71 (BP Location: Right Arm, Patient Position: Sitting, Cuff Size: Normal)   Pulse 60   Temp 97.8 F (36.6 C) (Oral)   Ht 6\' 2"  (1.88 m)   Wt 231 lb 12.8 oz (105.1 kg)   SpO2 98%   BMI 29.76 kg/m  BP Readings from Last 3 Encounters:  11/09/22 137/71  11/06/21 134/70  09/15/21 120/64     Physical Exam Constitutional:      Appearance: Normal appearance.  HENT:     Right Ear: Tympanic membrane, ear canal and external ear normal.  Left Ear: Tympanic membrane, ear canal and external ear normal.     Mouth/Throat:     Mouth: Mucous membranes are moist.     Pharynx: Oropharynx is clear.  Eyes:     Extraocular Movements: Extraocular movements intact.     Pupils: Pupils are equal, round, and reactive to light.  Cardiovascular:     Rate and Rhythm: Normal rate and regular rhythm.     Pulses: Normal pulses.     Heart sounds: Normal heart sounds.  Pulmonary:     Effort: Pulmonary effort is normal.     Breath sounds: Normal breath sounds.  Abdominal:     General: Abdomen is flat. Bowel sounds are normal.     Palpations: Abdomen is soft.  Musculoskeletal:        General: Normal range of motion.     Cervical back: Normal range of motion and neck supple.  Skin:    General: Skin  is warm and dry.  Neurological:     Mental Status: He is alert.  Psychiatric:        Mood and Affect: Mood normal.        Behavior: Behavior normal.        Thought Content: Thought content normal.        Judgment: Judgment normal.         Assessment & Plan:    Routine Health Maintenance and Physical Exam  Health Maintenance  Topic Date Due   HIV Screening  Never done   Hepatitis C Screening: USPSTF Recommendation to screen - Ages 56-79 yo.  Never done   COVID-19 Vaccine (3 - 2023-24 season) 04/03/2022   Flu Shot  03/04/2023   DTaP/Tdap/Td vaccine (2 - Td or Tdap) 11/07/2031   HPV Vaccine  Aged Out   1. Wellness examination Routine HCM labs ordered. Labs reviewed/discussed today.  Review of PMH, FH, SH, medications and HM performed.  Recommend healthy diet.  Recommend approximately 150 minutes/week of moderate intensity exercise. Recommend regular dental and vision exams. Always use seatbelt/lap and shoulder restraints. Recommend using smoke alarms and checking batteries at least twice a year. Recommend using sunscreen when outside. Discussed immunization recommendations. Vaccines are UTD.  2. Elevated serum creatinine Discussed etiologies that cause elevated serum creatinine, such as dehydration, high muscle mass, consuming excess protein/meats, and high-intensity exercise. GFR within normal range is reassuring.  Advised patient it would be reasonable to re-check CMP in 4-6 weeks.  - Comprehensive metabolic panel; future   Return in about 1 year (around 11/09/2023) for Physical with fasting labs.     Alyson Reedy, FNP

## 2022-12-10 ENCOUNTER — Other Ambulatory Visit (HOSPITAL_BASED_OUTPATIENT_CLINIC_OR_DEPARTMENT_OTHER): Payer: BC Managed Care – PPO

## 2022-12-10 ENCOUNTER — Other Ambulatory Visit (HOSPITAL_BASED_OUTPATIENT_CLINIC_OR_DEPARTMENT_OTHER): Payer: Self-pay | Admitting: Family Medicine

## 2022-12-10 DIAGNOSIS — R7989 Other specified abnormal findings of blood chemistry: Secondary | ICD-10-CM

## 2022-12-10 LAB — COMPREHENSIVE METABOLIC PANEL
ALT: 17 IU/L (ref 0–44)
AST: 30 IU/L (ref 0–40)
Albumin/Globulin Ratio: 1.9 (ref 1.2–2.2)
Albumin: 4.3 g/dL (ref 4.1–5.1)
Alkaline Phosphatase: 71 IU/L (ref 44–121)
BUN/Creatinine Ratio: 11 (ref 9–20)
BUN: 16 mg/dL (ref 6–20)
Bilirubin Total: 0.2 mg/dL (ref 0.0–1.2)
CO2: 22 mmol/L (ref 20–29)
Calcium: 9 mg/dL (ref 8.7–10.2)
Chloride: 104 mmol/L (ref 96–106)
Creatinine, Ser: 1.4 mg/dL — ABNORMAL HIGH (ref 0.76–1.27)
Globulin, Total: 2.3 g/dL (ref 1.5–4.5)
Glucose: 92 mg/dL (ref 70–99)
Potassium: 4.5 mmol/L (ref 3.5–5.2)
Sodium: 140 mmol/L (ref 134–144)
Total Protein: 6.6 g/dL (ref 6.0–8.5)
eGFR: 67 mL/min/{1.73_m2} (ref >59)

## 2022-12-22 DIAGNOSIS — Z6828 Body mass index (BMI) 28.0-28.9, adult: Secondary | ICD-10-CM | POA: Diagnosis not present

## 2022-12-22 DIAGNOSIS — J029 Acute pharyngitis, unspecified: Secondary | ICD-10-CM | POA: Diagnosis not present

## 2022-12-22 DIAGNOSIS — J301 Allergic rhinitis due to pollen: Secondary | ICD-10-CM | POA: Diagnosis not present

## 2023-06-24 ENCOUNTER — Encounter (HOSPITAL_BASED_OUTPATIENT_CLINIC_OR_DEPARTMENT_OTHER): Payer: Self-pay | Admitting: Family Medicine

## 2023-09-20 DIAGNOSIS — H5712 Ocular pain, left eye: Secondary | ICD-10-CM | POA: Diagnosis not present

## 2023-09-20 DIAGNOSIS — H1032 Unspecified acute conjunctivitis, left eye: Secondary | ICD-10-CM | POA: Diagnosis not present
